# Patient Record
Sex: Female | Born: 1960 | Race: White | Hispanic: No | Marital: Single | Smoking: Current every day smoker
Health system: Southern US, Community
[De-identification: ages and names within clinical notes are randomized; demographics above are authoritative.]

## PROBLEM LIST (undated history)

## (undated) DIAGNOSIS — E079 Disorder of thyroid, unspecified: Secondary | ICD-10-CM

---

## 2016-07-18 LAB — GLUCOSE, POCT (MANUAL RESULT ENTRY): POC Glucose: 97 mg/dl (ref 70–99)

## 2016-07-21 ENCOUNTER — Encounter: Payer: Self-pay | Admitting: Pediatric Intensive Care

## 2016-07-21 DIAGNOSIS — I10 Essential (primary) hypertension: Secondary | ICD-10-CM

## 2016-08-01 ENCOUNTER — Encounter: Payer: Self-pay | Admitting: Pediatric Intensive Care

## 2016-08-01 DIAGNOSIS — I10 Essential (primary) hypertension: Secondary | ICD-10-CM

## 2016-08-04 ENCOUNTER — Encounter: Payer: Self-pay | Admitting: Pediatric Intensive Care

## 2016-08-11 ENCOUNTER — Encounter: Payer: Self-pay | Admitting: Pediatric Intensive Care

## 2016-08-11 NOTE — Congregational Nurse Program (Unsigned)
Congregational Nurse Program Note  Date of Encounter: 08/01/2016  Past Medical History: No past medical history on file.  Encounter Details:     CNP Questionnaire - 08/01/16 0900      Patient Demographics   Is this a new or existing patient? Existing   Patient is considered a/an Not Applicable   Race Caucasian/White     Patient Assistance   Location of Patient Assistance [facility]   Patient's financial/insurance status Self-Pay   Uninsured Patient Yes   Interventions Assisted patient in making appt.   Patient referred to apply for the following financial assistance [employer]   Food insecurities addressed Not Applicable   Transportation assistance No   Assistance securing medications Yes   Type of Assistance Other   Educational health offerings Hypertension;Navigating the healthcare system;Medications     Encounter Details   Primary purpose of visit Navigating the Healthcare System;Chronic Illness/Condition Visit   Was an Emergency Department visit averted? Not Applicable   Does patient have a medical provider? Yes   Patient referred to Follow up with established PCP   Was a mental health screening completed? (GAINS tool) No   Does patient have dental issues? No   Does patient have vision issues? No   Does your patient have an abnormal blood pressure today? No   Since previous encounter, have you referred patient for abnormal blood pressure that resulted in a new diagnosis or medication change? No   Does your patient have an abnormal blood glucose today? No   Since previous encounter, have you referred patient for abnormal blood glucose that resulted in a new diagnosis or medication change? No   Was there a life-saving intervention made? No     In clinic for BP check. Client states she is having increased lower leg edema agai. She ws instructed to take lasix as needed for swelling. Client will return to client on Friday for follow up.

## 2016-08-11 NOTE — Congregational Nurse Program (Unsigned)
Congregational Nurse Program Note  Date of Encounter: 07/21/2016  Past Medical History: No past medical history on file.  Encounter Details:     CNP Questionnaire - 08/01/16 0900      Patient Demographics   Is this a new or existing patient? Existing   Patient is considered a/an Not Applicable   Race Caucasian/White     Patient Assistance   Location of Patient Assistance [facility]   Patient's financial/insurance status Self-Pay   Uninsured Patient Yes   Interventions Assisted patient in making appt.   Patient referred to apply for the following financial assistance [employer]   Food insecurities addressed Not Applicable   Transportation assistance No   Assistance securing medications Yes   Type of Assistance Other   Educational health offerings Hypertension;Navigating the healthcare system;Medications     Encounter Details   Primary purpose of visit Navigating the Healthcare System;Chronic Illness/Condition Visit   Was an Emergency Department visit averted? Not Applicable   Does patient have a medical provider? Yes   Patient referred to Follow up with established PCP   Was a mental health screening completed? (GAINS tool) No   Does patient have dental issues? No   Does patient have vision issues? No   Does your patient have an abnormal blood pressure today? No   Since previous encounter, have you referred patient for abnormal blood pressure that resulted in a new diagnosis or medication change? No   Does your patient have an abnormal blood glucose today? No   Since previous encounter, have you referred patient for abnormal blood glucose that resulted in a new diagnosis or medication change? No   Was there a life-saving intervention made? No     Client recently released from incarceration. She is new to area. Needs [employer] [facility] and PCP due to history of hypertension. Client has 3+ pitting edema to lower extremities and feet. [facility] and be  seen at clinic and [facility]. [facility] client would be there ASAP.

## 2016-09-11 NOTE — Congregational Nurse Program (Signed)
Congregational Nurse Program Note  Date of Encounter: 08/04/2016  Past Medical History: No past medical history on file.  Encounter Details:  Client states she has increased leg swelling. Noted pitting edema with erythema on both legs below knees. [facility] CN will call [facility] to have client evaluated.

## 2017-10-23 ENCOUNTER — Other Ambulatory Visit: Payer: Self-pay | Admitting: *Deleted

## 2017-10-23 DIAGNOSIS — Z1231 Encounter for screening mammogram for malignant neoplasm of breast: Secondary | ICD-10-CM

## 2017-11-09 ENCOUNTER — Ambulatory Visit
Admission: RE | Admit: 2017-11-09 | Discharge: 2017-11-09 | Disposition: A | Discharge status: Home / Self Care | Payer: No Typology Code available for payment source | Source: Ambulatory Visit | Attending: *Deleted | Admitting: *Deleted

## 2017-11-09 ENCOUNTER — Encounter: Payer: Self-pay | Admitting: Radiology

## 2017-11-09 DIAGNOSIS — Z1231 Encounter for screening mammogram for malignant neoplasm of breast: Secondary | ICD-10-CM

## 2017-11-11 ENCOUNTER — Encounter (HOSPITAL_COMMUNITY): Payer: Self-pay

## 2017-11-27 ENCOUNTER — Other Ambulatory Visit (HOSPITAL_COMMUNITY): Payer: Self-pay | Admitting: *Deleted

## 2017-11-27 DIAGNOSIS — R928 Other abnormal and inconclusive findings on diagnostic imaging of breast: Secondary | ICD-10-CM

## 2017-12-03 ENCOUNTER — Ambulatory Visit (HOSPITAL_COMMUNITY)
Admission: RE | Admit: 2017-12-03 | Discharge: 2017-12-03 | Disposition: A | Discharge status: Home / Self Care | Payer: Self-pay | Source: Ambulatory Visit | Attending: Obstetrics and Gynecology | Admitting: Obstetrics and Gynecology

## 2017-12-03 ENCOUNTER — Ambulatory Visit
Admission: RE | Admit: 2017-12-03 | Discharge: 2017-12-03 | Disposition: A | Discharge status: Home / Self Care | Payer: No Typology Code available for payment source | Source: Ambulatory Visit | Attending: Obstetrics and Gynecology | Admitting: Obstetrics and Gynecology

## 2017-12-03 ENCOUNTER — Encounter (HOSPITAL_COMMUNITY): Payer: Self-pay

## 2017-12-03 VITALS — BP 138/75 | Ht 63.0 in

## 2017-12-03 DIAGNOSIS — R928 Other abnormal and inconclusive findings on diagnostic imaging of breast: Secondary | ICD-10-CM

## 2017-12-03 DIAGNOSIS — R87612 Low grade squamous intraepithelial lesion on cytologic smear of cervix (LGSIL): Secondary | ICD-10-CM

## 2017-12-03 DIAGNOSIS — Z1239 Encounter for other screening for malignant neoplasm of breast: Secondary | ICD-10-CM

## 2017-12-03 HISTORY — DX: Disorder of thyroid, unspecified: E07.9

## 2017-12-03 NOTE — Progress Notes (Signed)
[facility] due to recommending additional imaging of bilateral breast and [facility] [place] due to recommending a colposcopy to follow-up for an abnormal Pap smear. Screening mammogram completed 11/09/2017 and Pap smear completed 11/11/2017.  Pap Smear: Pap smear not completed today. [facility] [place] and LSIL with positive HPV. Referred patient to the [facility] for a colpscopy. Appointment scheduled for Friday, January 01, 2018 at 0815. Per patient has a history of an abnormal Pap smear around 2007 or 2008 that a repeat Pap smear was completed for follow-up. Patient stated all Pap smears have been normal since repeat Pap smear was completed. Last Pap smear result is in Epic.  Physical exam: Breasts Breasts symmetrical. No skin abnormalities bilateral breasts. No nipple retraction bilateral breasts. No nipple discharge bilateral breasts. No lymphadenopathy. No lumps palpated bilateral breasts. No complaints of pain or tenderness on exam. Ref[facility] for a bilateral diagnostic mammogram and bilateral breast ultrasound per recommendation. Appointment scheduled for Thursday, December 03, 2017 at 1320.        Pelvic/Bimanual No Pap smear completed today since last Pap smear was 11/11/2017. Pap smear not indicated per BCCCP guidelines.   Smoking History: Patient is a current smoker. Discussed smoking cessation with patient. Referred [facility] the [facility] and gave resources to free smoking cessation classes at [facility].  Patient Navigation: Patient education provided. Access to services provided for patient through BCCCP program.   Colorectal Cancer Screening: Per patient has never had a colonoscopy completed. No complaints today. FIT Test given to patient to complete and return to BCCCP.

## 2017-12-03 NOTE — Patient Instructions (Addendum)
Explained breast self awareness with Makailyn Vangie BickerNagy Cinque. Patient did not need a Pap smear today due to last Pap smear was 11/11/2017. Explained the colposcopy the recommended follow-up for her abnormal Pap smear. Ref[facility] the [facility] for a colpscopy. Appointment scheduled for Friday, January 01, 2018 at 0815. Referred [facility] for a bilateral diagnostic mammogram and bilateral breast ultrasound per recommendation. Appointment scheduled for Thursday, December 03, 2017 at 1320. Patient aware of appointments and will be there. Discussed smoking cessation with patient. Referred to the [facility] and gave resources to free smoking cessation classes at [facility]. Peace Vangie BickerNagy Bewley verbalized understanding.  Valene Villa, Kathaleen Maserhristine Poll, RN 12:44 PM

## 2017-12-08 ENCOUNTER — Other Ambulatory Visit: Payer: Self-pay

## 2017-12-09 LAB — FECAL OCCULT BLOOD, IMMUNOCHEMICAL: FECAL OCCULT BLD: NEGATIVE

## 2017-12-09 LAB — SPECIMEN STATUS REPORT

## 2017-12-14 ENCOUNTER — Encounter (HOSPITAL_COMMUNITY): Payer: Self-pay | Admitting: *Deleted

## 2018-01-01 ENCOUNTER — Ambulatory Visit (INDEPENDENT_AMBULATORY_CARE_PROVIDER_SITE_OTHER): Payer: Self-pay | Admitting: Obstetrics & Gynecology

## 2018-01-01 ENCOUNTER — Encounter: Payer: Self-pay | Admitting: Obstetrics & Gynecology

## 2018-01-01 VITALS — BP 134/77 | HR 67 | Wt 181.8 lb

## 2018-01-01 DIAGNOSIS — R87612 Low grade squamous intraepithelial lesion on cytologic smear of cervix (LGSIL): Secondary | ICD-10-CM

## 2018-01-01 NOTE — Progress Notes (Signed)
   Subjective:    Patient ID: Angelica Ellis, female    DOB: 1961-10-06, 57 y.o.   MRN: [id number]  HPI 57 yo P4  lady here for a colpo [facility]    Review of Systems She reports that she went through menopause about 7 years ago.    Objective:   Physical Exam  Breathing, conversing, and ambulating normally Well nourished, well hydrated White female, no apparent distress  UPT negative, consent signed, time out done Cervix prepped with acetic acid. Transformation zone not seen at all Cervix atrophic Cervix so stenotic that I could not obtain an ECC      Assessment & Plan:  LGSIL pap with inadequate colpo She will return for a LEEP I have discussed this with her and shown her pictures from the internet about the procedure.

## 2018-01-04 ENCOUNTER — Encounter: Payer: Self-pay | Admitting: *Deleted

## 2018-02-01 ENCOUNTER — Ambulatory Visit: Payer: Self-pay | Admitting: Obstetrics & Gynecology

## 2018-02-03 ENCOUNTER — Other Ambulatory Visit (HOSPITAL_COMMUNITY)
Admission: RE | Admit: 2018-02-03 | Discharge: 2018-02-03 | Disposition: A | Discharge status: Home / Self Care | Payer: Self-pay | Source: Ambulatory Visit | Attending: Obstetrics & Gynecology | Admitting: Obstetrics & Gynecology

## 2018-02-03 ENCOUNTER — Encounter: Payer: Self-pay | Admitting: Obstetrics & Gynecology

## 2018-02-03 ENCOUNTER — Ambulatory Visit (INDEPENDENT_AMBULATORY_CARE_PROVIDER_SITE_OTHER): Payer: Self-pay | Admitting: Obstetrics & Gynecology

## 2018-02-03 VITALS — BP 114/82 | HR 78 | Wt 184.9 lb

## 2018-02-03 DIAGNOSIS — R87612 Low grade squamous intraepithelial lesion on cytologic smear of cervix (LGSIL): Secondary | ICD-10-CM

## 2018-02-03 NOTE — Progress Notes (Signed)
   Subjective:    Patient ID: Angelica Ellis, female    DOB: October 29, 1961, 57 y.o.   MRN: [id number]  HPI 57 yo P4 here for a LEEP due to an inadequate colposcopy. Her colpo was normal but her cervix was so stenotic that I could not see the TZ or do a ECC.   Review of Systems     Objective:   Physical Exam  Colpo Biopsy:   Risks, benefits, alternatives, and limitations of procedure explained to patient, including pain, bleeding, infection, failure to remove abnormal tissue and failure to cure dysplasia, need for repeat procedures, damage to pelvic organs, cervical incompetence.  Role of HPV,cervical dysplasia and need for close followup was empasized. Informed written consent was obtained. All questions were answered. Time out performed. Urine pregnancy test was negative.  Procedure: The patient was placed in lithotomy position and the bivalved coated speculum was placed in the patient's vagina. A grounding pad placed on the patient.  Local anesthesia was administered via an intracervical block using 8cc of 2% Lidocaine with epinephrine. The suction was turned on and the large Loop on 50 Watts of cutting current was used to excise the entire transformation zone. I then did a "top hat" with a smaller loop.  I obtained an ECC.  Excellent hemostasis was achieved using roller ball coagulation set at 50 Watts coagulation current. I then placed Monsel's solution. The speculum was removed from the vagina. Specimens were sent to pathology.  The patient tolerated the procedure well. Post-operative instructions given to patient, including instruction to seek medical attention for persistent bright red bleeding, fever, abdominal/pelvic pain, dysuria, nausea or vomiting. She was also told about the possibility of having copious yellow to black tinged discharge for weeks. She was counseled to avoid anything in the vagina (sex/douching/tampons) for 3 weeks. She has a 4 week post-operative check to  assess wound healing, review results and discuss further management.       Assessment & Plan:  Inadequate colpo, LGSIL pap Await pathology results

## 2018-02-03 NOTE — Addendum Note (Signed)
Addended by: Garret ReddishBARNES, Shiva Sahagian M on: 02/03/2018 04:02 PM   Modules accepted: Orders

## 2018-02-08 ENCOUNTER — Encounter: Payer: Self-pay | Admitting: *Deleted

## 2018-04-28 ENCOUNTER — Telehealth (HOSPITAL_COMMUNITY): Payer: Self-pay | Admitting: *Deleted

## 2018-04-28 NOTE — Telephone Encounter (Signed)
Patient called with questions about bills that she has received for services related to [facility]. Told patient that I will need a copy of the bills to verify if covered by BCCCP and will let her know when received. Patient stated she will mail a copy of them.

## 2018-11-29 ENCOUNTER — Telehealth (HOSPITAL_COMMUNITY): Payer: Self-pay

## 2018-11-29 NOTE — Telephone Encounter (Signed)
Left message with patient letting her know that I was calling her to renew her BCCCP. Left her my number for her to call back.

## 2019-03-04 MED FILL — LEVOTHYROXINE 75 MCG TABLET: 75 | 30 days supply | Qty: 30 | Fill #0

## 2020-04-25 ENCOUNTER — Other Ambulatory Visit: Payer: Self-pay

## 2020-04-25 DIAGNOSIS — Z1231 Encounter for screening mammogram for malignant neoplasm of breast: Secondary | ICD-10-CM

## 2020-06-07 ENCOUNTER — Ambulatory Visit: Payer: Self-pay

## 2020-07-12 ENCOUNTER — Ambulatory Visit: Payer: Self-pay | Admitting: *Deleted

## 2020-07-12 ENCOUNTER — Other Ambulatory Visit: Payer: Self-pay

## 2020-07-12 VITALS — BP 130/84 | Temp 97.1°F | Wt 207.4 lb

## 2020-07-12 DIAGNOSIS — Z01419 Encounter for gynecological examination (general) (routine) without abnormal findings: Secondary | ICD-10-CM

## 2020-07-12 NOTE — Patient Instructions (Addendum)
Explained breast self awareness with Angelica Ellis. Pap smear completed today. Let patient know that if today's Pap smear is normal that her next Pap smear is due in one year due to her history of an abnormal Pap smear. Referred patient to the [facility] of [facility] for a screening mammogram on the mobile unit. Appointment scheduled Thursday, July 17, 2020 at 1510. Patient aware of appointment and will be there. Let patient know will follow up with her within the next couple weeks with results of her Pap smear by letter or phone. Informed patient that the Breast Center will follow-up with her within the next couple of weeks after mammogram with results by letter or phone. Discussed smoking cessation with patient. Referred to the [facility] Quitline and gave resources to the free smoking cessation classes at [facility]. Angelica Ellis verbalized understanding.  Agapito Hanway, Kathaleen Maser, RN 3:42 PM

## 2020-07-12 NOTE — Progress Notes (Signed)
[name] is a 59 y.o. G4P0 female who presents to [facility] clinic today with no complaints.    Pap Smear: Pap smear completed today. Last Pap smear was in September 2020 at [facility] of the [place] and normal per patient. Patients previous Pap smear 11/11/2017 at the [facility] the [place] and LSIL with positive HPV. 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 Per patient has a history of another abnormal Pap smear around 2007 or 2008 that a repeat Pap smear was completed for follow-up. Last Pap smear result is not in Epic. Previous Pap smear, Colposcopy, and LEEP results are in Epic.   Physical exam: Breasts Breasts symmetrical. No skin abnormalities bilateral breasts. No nipple retraction bilateral breasts. No nipple discharge bilateral breasts. No lymphadenopathy. No lumps palpated bilateral breasts. No complaints of pain or tenderness on exam.       Pelvic/Bimanual Ext Genitalia No lesions, no swelling and no discharge observed on external genitalia.        Vagina Vagina pink and normal texture. No lesions or discharge observed in vagina.        Cervix Cervix is present. Cervix pink and of normal texture. No discharge observed.    Uterus Uterus is present and palpable. Uterus in normal position and normal size.        Adnexae Bilateral ovaries present and palpable. No tenderness on palpation.         Rectovaginal No rectal exam completed today since patient had no rectal complaints. No skin abnormalities observed on exam.     Smoking History: Patient is a current smoker. Discussed smoking cessation with patient. Referred to the [facility] Quitline and gave resources to the free smoking cessation classes at [facility].   Patient Navigation: Patient education provided. Access to services provided for patient through BCCCP program.   Colorectal Cancer Screening: Per patient has never had  colonoscopy completed. No complaints today.    Breast and Cervical Cancer Risk Assessment: Patient has family history of her mother having breast cancer. Patient has no known genetic mutations or history of radiation treatment to the chest before age 19. Patient has history of cervical dysplasia. Patient has no history of being immunocompromised or DES exposure in-utero.  Risk Assessment    Risk Scores      07/12/2020   Last edited by: Angelica Rutherford, LPN   5-year risk: 2.4 %   Lifetime risk: 14.2 %          A: BCCCP exam with pap smear No complaints.  P: Referred patient to the [facility] for a screening mammogram on the mobile unit. Appointment scheduled Thursday, July 17, 2020 at 1510.  Angelica Heidelberg, RN 07/12/2020 3:41 PM

## 2020-07-16 LAB — CYTOLOGY - PAP
Comment: NEGATIVE
Diagnosis: NEGATIVE
High risk HPV: NEGATIVE

## 2020-07-19 ENCOUNTER — Ambulatory Visit
Admission: RE | Admit: 2020-07-19 | Discharge: 2020-07-19 | Disposition: A | Discharge status: Home / Self Care | Payer: No Typology Code available for payment source | Source: Ambulatory Visit | Attending: Obstetrics and Gynecology | Admitting: Obstetrics and Gynecology

## 2020-07-19 ENCOUNTER — Other Ambulatory Visit: Payer: Self-pay

## 2020-07-19 DIAGNOSIS — Z1231 Encounter for screening mammogram for malignant neoplasm of breast: Secondary | ICD-10-CM

## 2021-07-03 ENCOUNTER — Other Ambulatory Visit: Payer: Self-pay

## 2021-07-03 DIAGNOSIS — Z1231 Encounter for screening mammogram for malignant neoplasm of breast: Secondary | ICD-10-CM

## 2021-07-09 ENCOUNTER — Other Ambulatory Visit: Payer: Self-pay | Admitting: Obstetrics and Gynecology

## 2021-07-09 DIAGNOSIS — Z1231 Encounter for screening mammogram for malignant neoplasm of breast: Secondary | ICD-10-CM

## 2021-08-22 ENCOUNTER — Other Ambulatory Visit: Payer: Self-pay

## 2021-08-22 ENCOUNTER — Ambulatory Visit: Payer: Self-pay | Admitting: *Deleted

## 2021-08-22 VITALS — BP 116/86 | Wt 181.7 lb

## 2021-08-22 DIAGNOSIS — Z01419 Encounter for gynecological examination (general) (routine) without abnormal findings: Secondary | ICD-10-CM

## 2021-08-22 DIAGNOSIS — Z1211 Encounter for screening for malignant neoplasm of colon: Secondary | ICD-10-CM

## 2021-08-22 NOTE — Patient Instructions (Addendum)
Explained breast self awareness with Kandice Vangie Bicker. Pap smear completed today. Let her know that her next Pap smear will be due based on the result of today's Pap smear. Referred patient to the [facility] of [facility] for a screening mammogram on mobile unit. Appointment scheduled Thursday, August 29, 2021 at 1540. Patient aware of appointment and will be there. Let patient know will follow up with her within the next couple of weeks with results of her Pap smear by phone. Informed patient that the Breast Center will follow up with her within a couple weeks following appointment with results of mammogram by letter or phone. Discussed smoking cessation with patient. Referred to the [facility] Quitline and gave resources to the free smoking cessation classes at [facility]. Evette Vangie Bicker verbalized understanding.  Ansleigh Safer, Kathaleen Maser, RN 2:52 PM

## 2021-08-22 NOTE — Progress Notes (Signed)
[name] is a 60 y.o. G4P0 female who presents to [facility] clinic today with no complaints.    Pap Smear: Pap smear completed today. Last Pap smear was 07/12/2020 at [facility] and was normal with negative HPV. Per patient previous Pap smear September 2020 at [facility] [place] was normal and Patients Pap smear 11/11/2017 at the [facility] [place] was LSIL with positive HPV. 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 Per patient has a history of another abnormal Pap smear around 2007 or 2008 that a repeat Pap smear was completed for follow-up. Last Pap smear result is available in Epic. Previous Pap smear, Colposcopy, and LEEP results are in Epic.   Physical exam: Breasts Breasts symmetrical. No skin abnormalities bilateral breasts. No nipple retraction bilateral breasts. No nipple discharge bilateral breasts. No lymphadenopathy. No lumps palpated bilateral breasts. No complaints of pain or tenderness on exam.    MS DIGITAL SCREENING BILATERAL  Result Date: 11/25/2017 CLINICAL DATA:  Screening. EXAM: DIGITAL SCREENING BILATERAL MAMMOGRAM WITH CAD COMPARISON:  None could be obtained. ACR Breast Density Category b: There are scattered areas of fibroglandular density. FINDINGS: A possible mass in each breast requires further evaluation. Images were processed with CAD. IMPRESSION: Further evaluation is suggested for a possible mass in each breast. RECOMMENDATION: Diagnostic mammogram and possibly ultrasound of both breasts. (Code:[id number]-95M) The patient will be contacted regarding the findings, and additional imaging will be scheduled. BI-RADS CATEGORY  0: Incomplete. Need additional imaging evaluation and/or prior mammograms for comparison. Electronically Signed   By: Beckie Salts M.D.   On: 11/25/2017 11:19   MM DIAG BREAST TOMO BILATERAL  Result Date: 12/03/2017 CLINICAL DATA:  Possible mass in  the lateral aspect of each breast on a recent 2D screening mammogram. EXAM: 2D DIGITAL DIAGNOSTIC BILATERAL MAMMOGRAM WITH CAD AND ADJUNCT TOMO ULTRASOUND BILATERAL BREAST COMPARISON:  Previous exam(s). ACR Breast Density Category b: There are scattered areas of fibroglandular density. FINDINGS: 2D and 3D tomographic images both breast confirm a 3 mm oval, circumscribed mass in the lower outer quadrant of each breast. There is also a similar-sized benign appearing intramammary lymph node in the upper-outer left breast, containing a normal fatty notch. Mammographic images were processed with CAD. Targeted ultrasound is performed, showing a 3 mm normal appearing intramammary lymph node in the 4 o'clock position of the left breast, 4 cm from the nipple. A 4 mm cyst is demonstrated in the 9:30 o'clock position of the right breast, 4 cm from the nipple with an additional 3 mm cyst in the 9 o'clock position, 6 cm from the nipple. IMPRESSION: Normal left breast intramammary lymph nodes and small benign right breast cysts. No evidence of malignancy in either breast. RECOMMENDATION: Bilateral screening mammogram in 1 year. I have discussed the findings and recommendations with the patient. Results were also provided in writing at the conclusion of the visit. If applicable, a reminder letter will be sent to the patient regarding the next appointment. BI-RADS CATEGORY  2: Benign. Electronically Signed   By: Beckie Salts M.D.   On: 12/03/2017 16:37   MS DIGITAL SCREENING TOMO BILATERAL  Result Date: 07/23/2020 CLINICAL DATA:  Screening. EXAM: DIGITAL SCREENING BILATERAL MAMMOGRAM WITH TOMO AND CAD COMPARISON:  Previous exam(s). ACR Breast Density Category b: There are scattered areas of fibroglandular density. FINDINGS: There are no findings suspicious for malignancy. Images were processed with CAD. IMPRESSION: No mammographic  evidence of malignancy. A result letter of this screening mammogram will be mailed directly to the  patient. RECOMMENDATION: Screening mammogram in one year. (Code:[id number]) BI-RADS CATEGORY  1: Negative. Electronically Signed   By: Frederico Hamman M.D.   On: 07/23/2020 16:14    Pelvic/Bimanual Ext Genitalia No lesions, no swelling and no discharge observed on external genitalia.        Vagina Vagina pink and normal texture. No lesions or discharge observed in vagina.        Cervix Cervix is present. Cervix pink and of normal texture. No discharge observed.    Uterus Uterus is present and palpable. Uterus in normal position and normal size.        Adnexae Bilateral ovaries present and palpable. No tenderness on palpation.         Rectovaginal No rectal exam completed today since patient had no rectal complaints. No skin abnormalities observed on exam.     Smoking History: Patient is a current smoker. Discussed smoking cessation with patient. Referred to the [facility] Quitline and gave resources to the free smoking cessation classes at [facility].  Patient Navigation: Patient education provided. Access to services provided for patient through [facility] program. Patient provided bag of groceries due to food insecurities.   Colorectal Cancer Screening: Per patient has never had colonoscopy completed. FIT Test given to patient to complete. No complaints today.    Breast and Cervical Cancer Risk Assessment: Patient has family history of her mother having breast cancer. Patient has no known genetic mutations or history of radiation treatment to the chest before age 18. Patient has history of cervical dysplasia. Patient has no history of being immunocompromised or DES exposure in-utero.  Risk Assessment     Risk Scores       08/22/2021 07/12/2020   Last edited by: Priscille Heidelberg, RN McGill, Fidel Levy, LPN   5-year risk: 2.5 % 2.4 %   Lifetime risk: 13.8 % 14.2 %            A: BCCCP exam with pap smear No complaints.  P: Referred patient to the [facility] Center of [facility]  for a screening mammogram on mobile unit. Appointment scheduled Thursday, August 29, 2021 at 1540.  Priscille Heidelberg, RN 08/22/2021 2:52 PM

## 2021-08-23 LAB — CYTOLOGY - PAP
Comment: NEGATIVE
Diagnosis: NEGATIVE
High risk HPV: NEGATIVE

## 2021-08-26 ENCOUNTER — Telehealth: Payer: Self-pay

## 2021-08-26 NOTE — Telephone Encounter (Signed)
Called patient to give pap smear results. Informed patient that pap smear was normal and HPV was negative. Based on this result and her previous hx next pap smear will be due in 3 years. Patient voiced understanding.

## 2021-08-28 ENCOUNTER — Telehealth: Payer: Self-pay

## 2021-08-28 LAB — FECAL OCCULT BLOOD, IMMUNOCHEMICAL: Fecal Occult Bld: NEGATIVE

## 2021-08-28 NOTE — Telephone Encounter (Signed)
Attempted to contact patient to give FIT test results. Left name and number for patient to call back.

## 2021-08-29 ENCOUNTER — Other Ambulatory Visit: Payer: Self-pay

## 2021-08-29 ENCOUNTER — Ambulatory Visit
Admission: RE | Admit: 2021-08-29 | Discharge: 2021-08-29 | Disposition: A | Discharge status: Home / Self Care | Payer: No Typology Code available for payment source | Source: Ambulatory Visit | Attending: Obstetrics and Gynecology | Admitting: Obstetrics and Gynecology

## 2021-09-02 ENCOUNTER — Telehealth: Payer: Self-pay

## 2021-09-02 NOTE — Telephone Encounter (Signed)
Called patient to give FIT test results. Informed patient that FIT test was Normal. Patient voiced understanding.  

## 2022-05-14 ENCOUNTER — Ambulatory Visit (HOSPITAL_COMMUNITY): Payer: No Payment, Other | Admitting: Licensed Clinical Social Worker

## 2023-09-01 IMAGING — MG MM DIGITAL SCREENING BILAT W/ TOMO AND CAD
8 series · 9 of 24 positions shown · non-contrast
Comparison: Previous exam(s).

CLINICAL DATA: Screening.

EXAM:
DIGITAL SCREENING BILATERAL MAMMOGRAM WITH TOMOSYNTHESIS AND CAD
TECHNIQUE: Bilateral screening digital craniocaudal and mediolateral oblique
mammograms were obtained. Bilateral screening digital breast
tomosynthesis was performed. The images were evaluated with
computer-aided detection.

[R CC synth-2D]
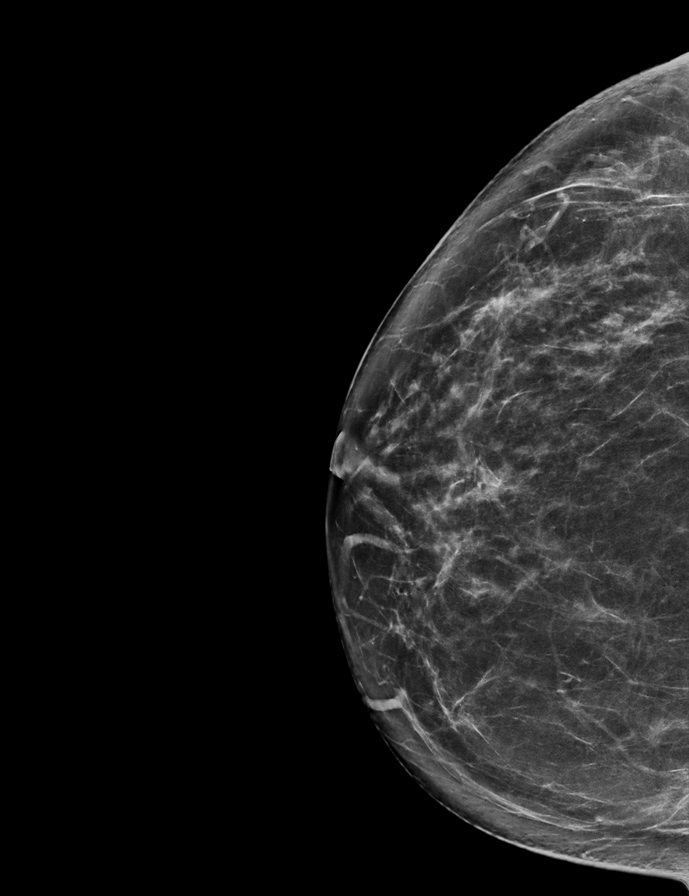

[L MLO synth-2D]
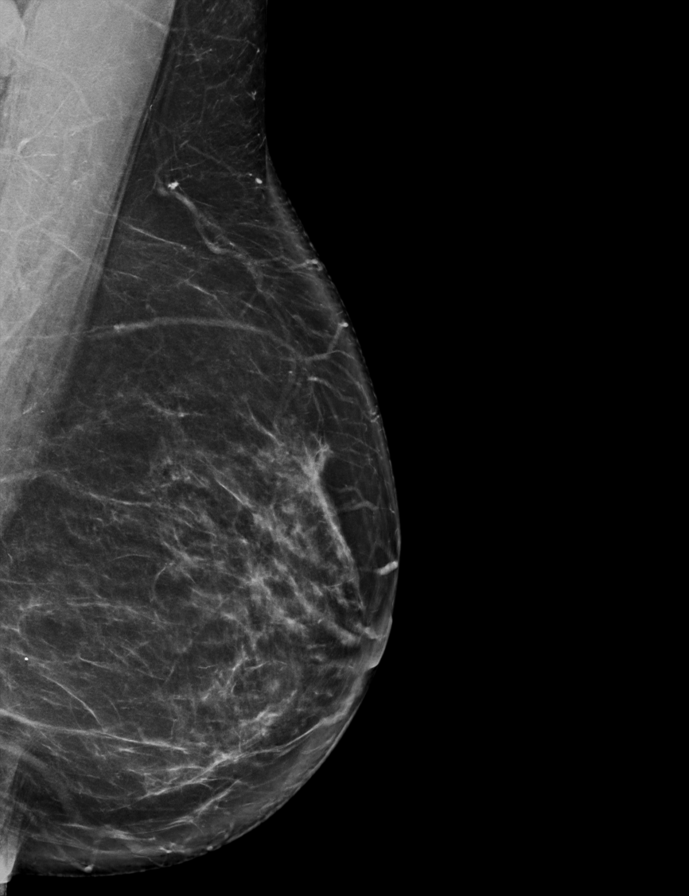

[R MLO synth-2D]
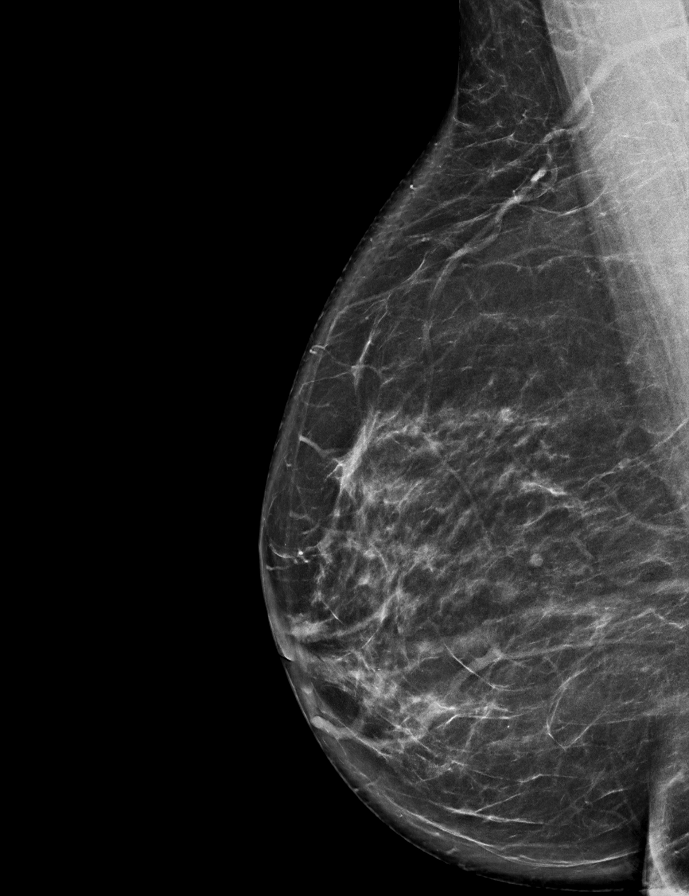

[L CC synth-2D]
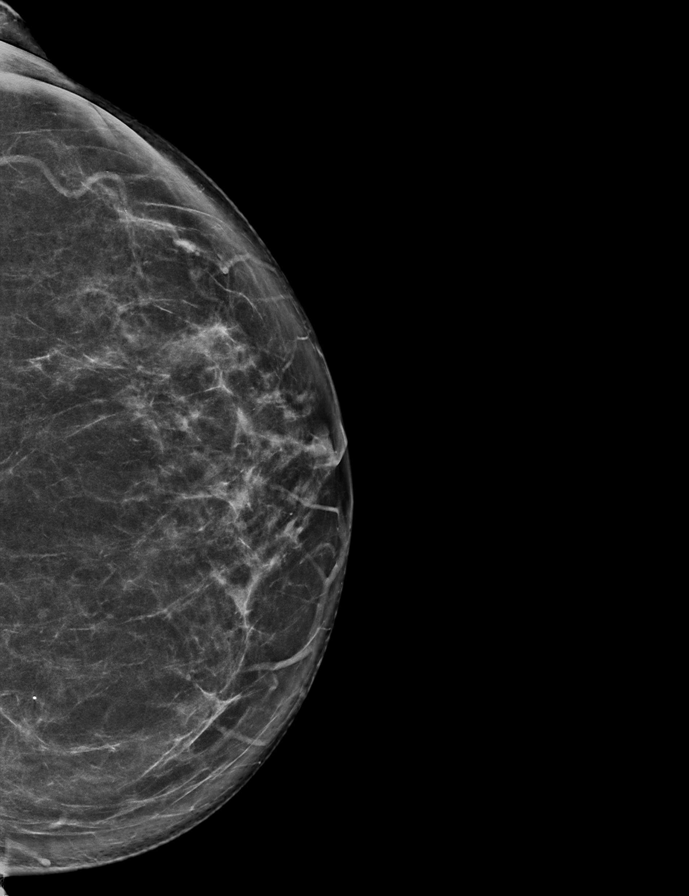

[L CC tomo · 2 of 78 frames shown]
[frame 26/78]
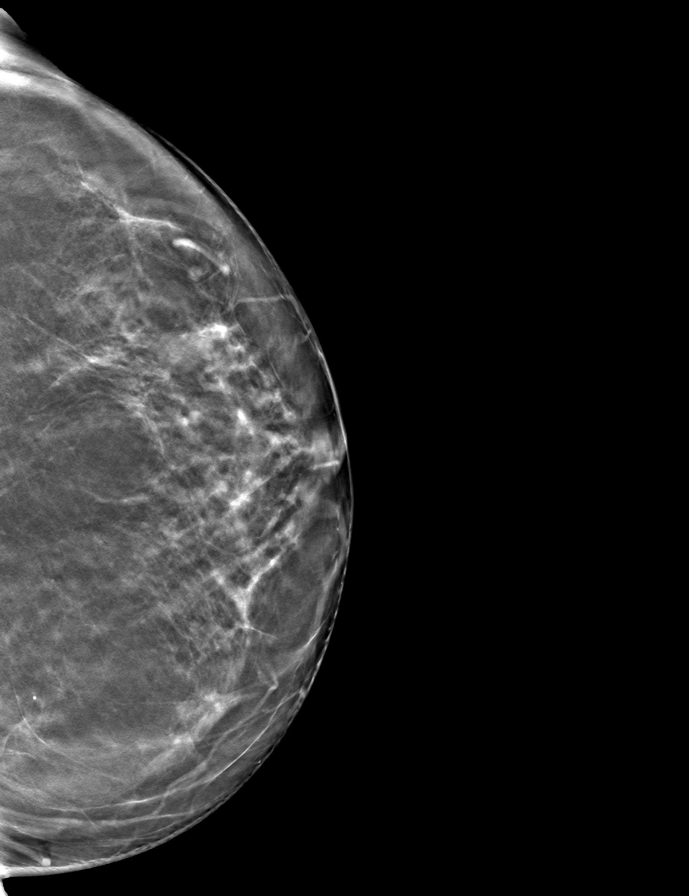
[frame 39/78]
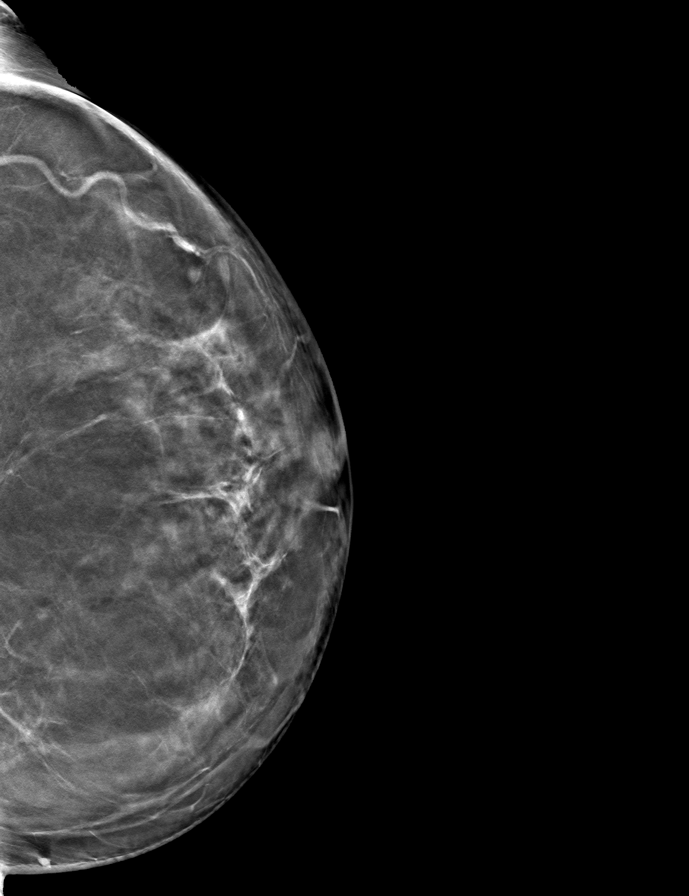

[R MLO tomo · tomo slice 41/82.0]
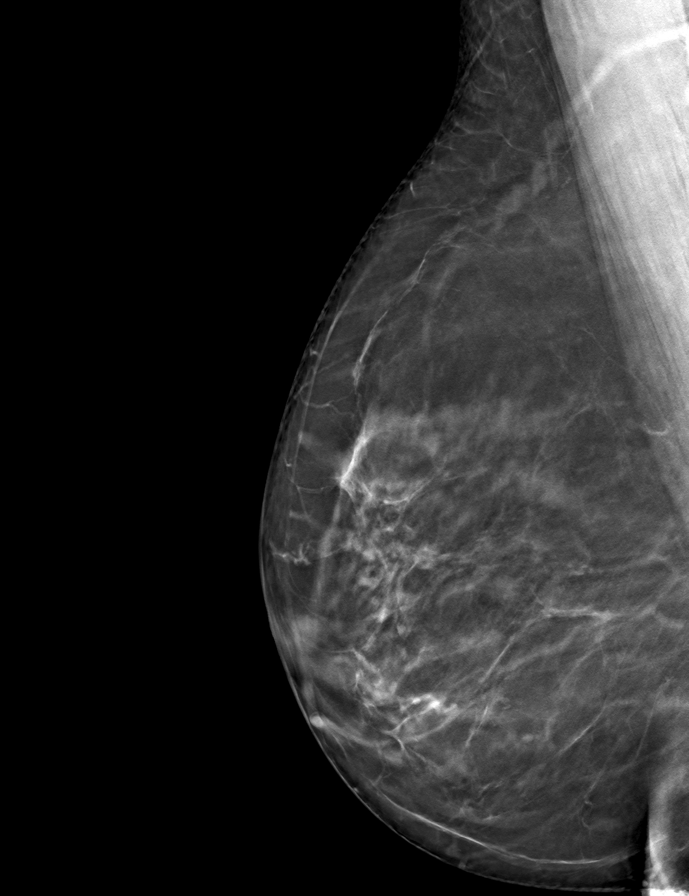

[L MLO tomo · tomo slice 41/81.0]
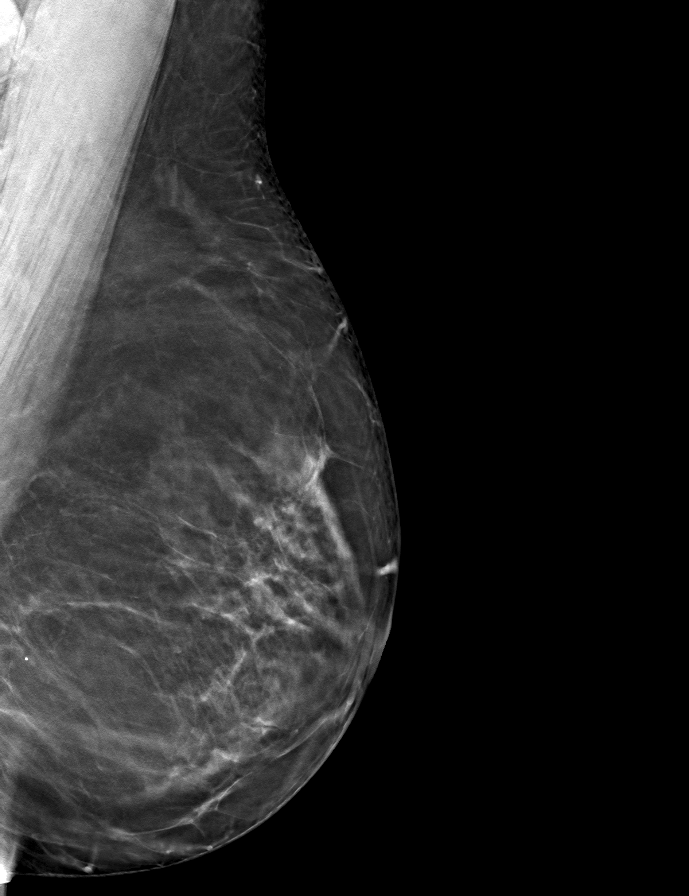

[R CC tomo · tomo slice 39/76.0]
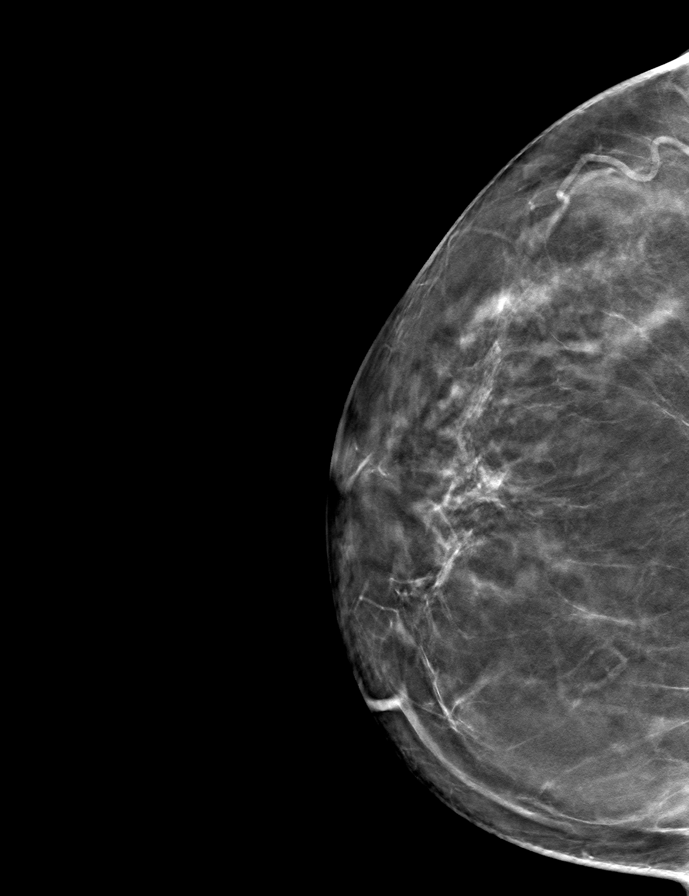

[9 of 24 positions shown; findings below may reference images not displayed]

ACR Breast Density Category b: There are scattered areas of
fibroglandular density.
FINDINGS: There are no findings suspicious for malignancy.
IMPRESSION: No mammographic evidence of malignancy. A result letter of this
screening mammogram will be mailed directly to the patient.

RECOMMENDATION:
Screening mammogram in one year. (Code:51-O-LD2)

BI-RADS CATEGORY  1: Negative.
